# Patient Record
Sex: Female | Born: 1960 | Race: White | Hispanic: No | Marital: Married | Smoking: Never smoker
Health system: Southern US, Community
[De-identification: ages and names within clinical notes are randomized; demographics above are authoritative.]

## PROBLEM LIST (undated history)

## (undated) DIAGNOSIS — K219 Gastro-esophageal reflux disease without esophagitis: Secondary | ICD-10-CM

## (undated) DIAGNOSIS — I471 Supraventricular tachycardia: Secondary | ICD-10-CM

## (undated) DIAGNOSIS — T7840XA Allergy, unspecified, initial encounter: Secondary | ICD-10-CM

## (undated) DIAGNOSIS — R7301 Impaired fasting glucose: Secondary | ICD-10-CM

## (undated) DIAGNOSIS — M797 Fibromyalgia: Secondary | ICD-10-CM

## (undated) DIAGNOSIS — F419 Anxiety disorder, unspecified: Secondary | ICD-10-CM

## (undated) DIAGNOSIS — M1990 Unspecified osteoarthritis, unspecified site: Secondary | ICD-10-CM

## (undated) HISTORY — DX: Fibromyalgia: M79.7

## (undated) HISTORY — DX: Gastro-esophageal reflux disease without esophagitis: K21.9

## (undated) HISTORY — DX: Allergy, unspecified, initial encounter: T78.40XA

## (undated) HISTORY — DX: Anxiety disorder, unspecified: F41.9

## (undated) HISTORY — PX: BLADDER SURGERY: SHX569

## (undated) HISTORY — DX: Impaired fasting glucose: R73.01

## (undated) HISTORY — DX: Unspecified osteoarthritis, unspecified site: M19.90

## (undated) HISTORY — DX: Supraventricular tachycardia: I47.1

---

## 1990-10-08 HISTORY — PX: TUBAL LIGATION: SHX77

## 1998-09-29 ENCOUNTER — Other Ambulatory Visit: Admission: RE | Admit: 1998-09-29 | Discharge: 1998-09-29 | Payer: Self-pay | Admitting: Gynecology

## 2000-02-12 ENCOUNTER — Other Ambulatory Visit: Admission: RE | Admit: 2000-02-12 | Discharge: 2000-02-12 | Payer: Self-pay | Admitting: Gynecology

## 2001-02-18 ENCOUNTER — Other Ambulatory Visit: Admission: RE | Admit: 2001-02-18 | Discharge: 2001-02-18 | Payer: Self-pay | Admitting: Gynecology

## 2003-01-11 ENCOUNTER — Other Ambulatory Visit: Admission: RE | Admit: 2003-01-11 | Discharge: 2003-01-11 | Payer: Self-pay | Admitting: Gynecology

## 2004-05-22 ENCOUNTER — Other Ambulatory Visit: Admission: RE | Admit: 2004-05-22 | Discharge: 2004-05-22 | Payer: Self-pay | Admitting: Gynecology

## 2005-06-04 ENCOUNTER — Other Ambulatory Visit: Admission: RE | Admit: 2005-06-04 | Discharge: 2005-06-04 | Payer: Self-pay | Admitting: Gynecology

## 2006-12-07 HISTORY — PX: CHOLECYSTECTOMY: SHX55

## 2007-02-06 HISTORY — PX: PARTIAL HYSTERECTOMY: SHX80

## 2007-02-11 ENCOUNTER — Inpatient Hospital Stay (HOSPITAL_COMMUNITY): Admission: RE | Admit: 2007-02-11 | Discharge: 2007-02-12 | Payer: Self-pay | Admitting: Obstetrics and Gynecology

## 2011-01-01 ENCOUNTER — Other Ambulatory Visit: Payer: Self-pay | Admitting: Gynecology

## 2011-04-02 ENCOUNTER — Ambulatory Visit (INDEPENDENT_AMBULATORY_CARE_PROVIDER_SITE_OTHER): Payer: 59 | Admitting: Gastroenterology

## 2011-04-02 ENCOUNTER — Encounter: Payer: Self-pay | Admitting: Gastroenterology

## 2011-04-02 ENCOUNTER — Other Ambulatory Visit (INDEPENDENT_AMBULATORY_CARE_PROVIDER_SITE_OTHER): Payer: 59

## 2011-04-02 VITALS — BP 124/80 | HR 84 | Ht 60.0 in | Wt 161.0 lb

## 2011-04-02 DIAGNOSIS — R1013 Epigastric pain: Secondary | ICD-10-CM

## 2011-04-02 DIAGNOSIS — R197 Diarrhea, unspecified: Secondary | ICD-10-CM

## 2011-04-02 LAB — HEPATIC FUNCTION PANEL
ALT: 22 U/L (ref 0–35)
AST: 21 U/L (ref 0–37)
Alkaline Phosphatase: 84 U/L (ref 39–117)
Bilirubin, Direct: 0.1 mg/dL (ref 0.0–0.3)
Total Bilirubin: 0.7 mg/dL (ref 0.3–1.2)

## 2011-04-02 MED ORDER — HYOSCYAMINE SULFATE 0.125 MG SL SUBL: SUBLINGUAL_TABLET | SUBLINGUAL | Status: DC

## 2011-04-02 MED ORDER — OMEPRAZOLE 40 MG PO CPDR
40.0000 mg | DELAYED_RELEASE_CAPSULE | Freq: Every day | ORAL | Status: DC
Start: 2011-04-02 — End: 2018-07-16

## 2011-04-02 MED ORDER — PEG-KCL-NACL-NASULF-NA ASC-C 100 G PO SOLR: 1.0000 | Freq: Once | ORAL | Status: DC

## 2011-04-02 NOTE — Progress Notes (Signed)
History of Present Illness: This is a 50 year old female who relates a long history of urgent, postprandial, loose stools. These symptoms have not changed for many years. She denies rectal bleeding and weight loss. She also has episodic epigastric pain associated with occasional reflux symptoms. She generally has minimal epigastric discomfort occasionally exacerbated by meals. She has had 2 severe attacks of epigastric pain which led to emergency room visits most recently on June 20. Her  records from that visit at [facility] were reviewed. Her blood work and urinalysis were unremarkable. Chest x-ray, CT scan of the head, chest and abdomen were unremarkable except for stool noted in the cecum. During her most recent ER visit she also had weakness, dizziness and near syncope. She is status post cholecystectomy 2008. She denies dysphagia, nausea, vomiting, weight loss, hematochezia, melena, hematemesis.  Past Medical History  Diagnosis Date  . Fibromyalgia    Past Surgical History  Procedure Date  . Cholecystectomy   . Partial hysterectomy   . Tubal ligation 1992  . Bladder surgery     bladder tact    reports that she has never smoked. She has never used smokeless tobacco. She reports that she does not drink alcohol or use illicit drugs. family history includes Heart disease in her maternal grandmother; Hiatal hernia in her maternal grandmother; Hypertension in her mother; Lung cancer in her paternal grandmother; Parkinsonism in her maternal grandfather; and Prostate cancer in her father. No Known Allergies   Outpatient Encounter Prescriptions as of 04/02/2011  Medication Sig Dispense Refill  . doxycycline (VIBRAMYCIN) 100 MG capsule Take 100 mg by mouth 2 (two) times daily.        . cetirizine (ZYRTEC) 10 MG tablet Take 10 mg by mouth daily.        . Coenzyme Q10 (CO Q-10) 300 MG CAPS Take 1 tablet by mouth daily.        . Multiple Vitamins-Minerals (MEGA MULTIVITAMIN FOR WOMEN PO) Take 1  tablet by mouth daily.        . Omega-3 Fatty Acids (FISH OIL TRIPLE STRENGTH PO) Take 1 tablet by mouth daily.        . Vitamin D, Ergocalciferol, (DRISDOL) 50000 UNITS CAPS Take 50,000 Units by mouth every 7 (seven) days.         Review of Systems: Pertinent positive and negative review of systems were noted in the above HPI section. All other review of systems were otherwise negative.  Physical Exam: General: Well developed , well nourished, no acute distress Head: Normocephalic and atraumatic Eyes:  sclerae anicteric, EOMI Ears: Normal auditory acuity Mouth: No deformity or lesions Neck: Supple, no masses or thyromegaly Lungs: Clear throughout to auscultation Heart: Regular rate and rhythm; no murmurs, rubs or bruits Abdomen: Soft, non tender and non distended. No masses, hepatosplenomegaly or hernias noted. Normal Bowel sounds Rectal:Deferred to colonoscopy   Musculoskeletal: Symmetrical with no gross deformities  Skin: No lesions on visible extremities Pulses:  Normal pulses noted Extremities: No clubbing, cyanosis, edema or deformities noted Neurological: Alert oriented x 4, grossly nonfocal Cervical Nodes:  No significant cervical adenopathy Inguinal Nodes: No significant inguinal adenopathy Psychological:  Alert and cooperative. Anxious. Normal mood and affect.  Assessment and Recommendations:  1. Recurrent epigastric pain. Rule out GERD, ulcer disease, choledocholithiasis, and functional symptoms. Recent liver function tests were normal. Repeat liver function tests today. If her liver function tests are elevated, or no other cause is found for her symptoms, consider MRCP. Begin omeprazole 40 mg  daily and antireflux measures. Schedule endoscopy. The risks, benefits, and alternatives to endoscopy with possible biopsy and possible dilation were discussed with the patient and they consent to proceed.   2. Chronic postprandial diarrhea with epigastric discomfort. Suspected  irritable bowel syndrome. Trial of hyoscyamine 1 to 2 before meals. Rule out inflammatory bowel disease. Schedule colonoscopy. The risks, benefits, and alternatives to colonoscopy with possible biopsy and possible polypectomy were discussed with the patient and they consent to proceed.

## 2011-04-02 NOTE — Patient Instructions (Signed)
Go directly to the basement to have your labs drawn. You have been scheduled for a Upper Endoscopy/ Colonoscopy. Separate instructions given. Your prescriptions have been sent to your pharmacy.  Patient advised to avoid spicy, acidic, citrus, chocolate, mints, fruit and fruit juices.  Limit the intake of caffeine, alcohol and Soda.  Don't exercise too soon after eating.  Don't lie down within 3-4 hours of eating.  Elevate the head of your bed.

## 2011-04-17 ENCOUNTER — Encounter: Payer: 59 | Admitting: Gastroenterology

## 2011-05-11 ENCOUNTER — Encounter: Payer: 59 | Admitting: Gastroenterology

## 2013-11-19 ENCOUNTER — Other Ambulatory Visit: Payer: Self-pay | Admitting: Gynecology

## 2015-05-18 ENCOUNTER — Other Ambulatory Visit: Payer: Self-pay | Admitting: *Deleted

## 2015-05-18 DIAGNOSIS — R200 Anesthesia of skin: Secondary | ICD-10-CM

## 2015-06-06 ENCOUNTER — Telehealth: Payer: Self-pay | Admitting: Neurology

## 2015-06-06 NOTE — Telephone Encounter (Signed)
Informed patient that she can continue as normal and that we have not asked anyone to hold meds before.

## 2015-06-06 NOTE — Telephone Encounter (Signed)
Pt wants to know will taking the 10mg  of" Flexeril"/Gabapentin meds/ before going to bed tonight? Her EMG is tomorrow/ [phone]

## 2015-06-06 NOTE — Telephone Encounter (Signed)
Called patient back and left message for her to call me back.  

## 2015-06-07 ENCOUNTER — Ambulatory Visit (INDEPENDENT_AMBULATORY_CARE_PROVIDER_SITE_OTHER): Payer: BLUE CROSS/BLUE SHIELD | Admitting: Neurology

## 2015-06-07 DIAGNOSIS — G5602 Carpal tunnel syndrome, left upper limb: Secondary | ICD-10-CM

## 2015-06-07 DIAGNOSIS — R200 Anesthesia of skin: Secondary | ICD-10-CM | POA: Diagnosis not present

## 2015-06-07 NOTE — Procedures (Signed)
[facility]  [place]  [place], [place] [address] Tel: [phone] Fax:  [phone] Test Date:  06/07/2015  Patient: [place] Rojas DOB: 02/04/1961 Physician: Narda Amber  Sex: Female Height: 4\' 10"  Ref Phys: D. Theda Sers, M.D.  ID#: [id number] Temp: 32.9C Technician: Jerilynn Mages. Dean   Patient Complaints: This is a 54 year old female presenting for evaluation of left hand numbness and left low back pain radiating into the left hip.   NCV & EMG Findings: Extensive electrodiagnostic testing of the left upper extremity and lower extremity shows:  1. Left median sensory response is prolonged distal peak latency (4.3 ms) and reduced amplitude (10.8 V).  Left ulnar and radial sensory responses are within normal limits.  2. Left median and ulnar motor responses are within normal limits. 3. Left sural and superficial peroneal sensory responses are within normal limits. 4. Left peroneal and tibial motor responses are within normal limits. 5. There is no evidence of active or chronic motor axon loss changes affecting any of the tested muscles. Motor unit configuration and recruitment pattern is within normal limits.   Impression: 1. Left median neuropathy at or distal to the wrist, consistent with the clinical diagnosis of carpal tunnel syndrome. Overall, these findings are moderate in degree electrically.  2. There is no evidence of a generalized sensorimotor polyneuropathy, diffuse myopathy, or cervical/lumbosacral radiculopathy affecting the left upper or lower extremities.   ___________________________ Narda Amber, DO    Nerve Conduction Studies Anti Sensory Summary Table   Stim Site NR Peak (ms) Norm Peak (ms) P-T Amp (V) Norm P-T Amp  Left Median Anti Sensory (2nd Digit)  32.9C  Wrist    4.3 <3.6 10.8 >15  Left Radial Anti Sensory (Base 1st Digit)  32.9C  Wrist    2.0 <2.7 33.0 >14  Left Sup Peroneal Anti Sensory (Ant Lat Mall)  32.9C  12 cm    3.0 <4.6  16.4 >4  Left Sural Anti Sensory (Lat Mall)  32.9C  Calf    3.2 <4.6 9.4 >4  Left Ulnar Anti Sensory (5th Digit)  32.9C  Wrist    2.6 <3.1 24.3 >10   Motor Summary Table   Stim Site NR Onset (ms) Norm Onset (ms) O-P Amp (mV) Norm O-P Amp Site1 Site2 Delta-0 (ms) Dist (cm) Vel (m/s) Norm Vel (m/s)  Left Median Motor (Abd Poll Brev)  32.9C  Wrist    3.7 <4.0 7.4 >6 Elbow Wrist 4.2 24.0 57 >50  Elbow    7.9  6.8         Left Peroneal Motor (Ext Dig Brev)  32.9C  Ankle    3.9 <6.0 3.2 >2.5 B Fib Ankle 5.7 30.0 53 >40  B Fib    9.6  3.1  Poplt B Fib 1.6 10.0 63 >40  Poplt    11.2  3.1         Left Tibial Motor (Abd Hall Brev)  32.9C  Ankle    3.7 <6.0 16.9 >4 Knee Ankle 7.6 36.0 47 >40  Knee    11.3  12.0         Left Ulnar Motor (Abd Dig Minimi)  32.9C  Wrist    2.1 <3.1 10.4 >7 B Elbow Wrist 2.4 14.0 58 >50  B Elbow    4.5  9.7  A Elbow B Elbow 1.6 10.0 63 >50  A Elbow    6.1  9.5          H Reflex Studies  NR H-Lat (ms) Lat Norm (ms) L-R H-Lat (ms)  Left Tibial (Gastroc)  32.9C     29.39 <35    EMG   Side Muscle Ins Act Fibs Psw Fasc Number Recrt Dur Dur. Amp Amp. Poly Poly. Comment  Left 1stDorInt Nml Nml Nml Nml Nml Nml Nml Nml Nml Nml Nml Nml N/A  Left Abd Poll Brev Nml Nml Nml Nml Nml Nml Nml Nml Nml Nml Nml Nml N/A  Left Ext Indicis Nml Nml Nml Nml Nml Nml Nml Nml Nml Nml Nml Nml N/A  Left PronatorTeres Nml Nml Nml Nml Nml Nml Nml Nml Nml Nml Nml Nml N/A  Left Biceps Nml Nml Nml Nml Nml Nml Nml Nml Nml Nml Nml Nml N/A  Left Triceps Nml Nml Nml Nml Nml Nml Nml Nml Nml Nml Nml Nml N/A  Left Deltoid Nml Nml Nml Nml Nml Nml Nml Nml Nml Nml Nml Nml N/A  Left AntTibialis Nml Nml Nml Nml Nml Nml Nml Nml Nml Nml Nml Nml N/A  Left Gastroc Nml Nml Nml Nml Nml Nml Nml Nml Nml Nml Nml Nml N/A  Left Flex Dig Long Nml Nml Nml Nml Nml Nml Nml Nml Nml Nml Nml Nml N/A  Left RectFemoris Nml Nml Nml Nml Nml Nml Nml Nml Nml Nml Nml Nml N/A  Left GluteusMed Nml Nml Nml Nml Nml Nml  Nml Nml Nml Nml Nml Nml N/A  Left BicepsFemS Nml Nml Nml Nml Nml Nml Nml Nml Nml Nml Nml Nml N/A      Waveforms:

## 2016-10-08 DIAGNOSIS — I471 Supraventricular tachycardia, unspecified: Secondary | ICD-10-CM

## 2016-10-08 HISTORY — PX: CARDIOVERSION: SHX1299

## 2016-10-08 HISTORY — DX: Supraventricular tachycardia: I47.1

## 2016-10-08 HISTORY — DX: Supraventricular tachycardia, unspecified: I47.10

## 2018-07-01 ENCOUNTER — Encounter: Payer: Self-pay | Admitting: Gastroenterology

## 2018-07-01 ENCOUNTER — Telehealth: Payer: Self-pay | Admitting: Gastroenterology

## 2018-07-01 NOTE — Telephone Encounter (Signed)
See note do you both approve of switch?

## 2018-07-01 NOTE — Telephone Encounter (Signed)
No problems 

## 2018-07-01 NOTE — Telephone Encounter (Signed)
OK 

## 2018-07-15 ENCOUNTER — Encounter: Payer: Self-pay | Admitting: Gastroenterology

## 2018-07-16 ENCOUNTER — Ambulatory Visit: Payer: BLUE CROSS/BLUE SHIELD | Admitting: Gastroenterology

## 2018-07-16 ENCOUNTER — Encounter (INDEPENDENT_AMBULATORY_CARE_PROVIDER_SITE_OTHER): Payer: Self-pay

## 2018-07-16 ENCOUNTER — Telehealth: Payer: Self-pay | Admitting: Gastroenterology

## 2018-07-16 ENCOUNTER — Encounter: Payer: Self-pay | Admitting: Gastroenterology

## 2018-07-16 VITALS — BP 132/78 | HR 82 | Ht 60.0 in | Wt 147.0 lb

## 2018-07-16 DIAGNOSIS — K224 Dyskinesia of esophagus: Secondary | ICD-10-CM

## 2018-07-16 DIAGNOSIS — Z1211 Encounter for screening for malignant neoplasm of colon: Secondary | ICD-10-CM | POA: Diagnosis not present

## 2018-07-16 DIAGNOSIS — K219 Gastro-esophageal reflux disease without esophagitis: Secondary | ICD-10-CM | POA: Diagnosis not present

## 2018-07-16 MED ORDER — OMEPRAZOLE 40 MG PO CPDR: 40.0000 mg | DELAYED_RELEASE_CAPSULE | Freq: Every day | ORAL | 11 refills | Status: DC

## 2018-07-16 MED ORDER — AMBULATORY NON FORMULARY MEDICATION: 0 refills | Status: DC

## 2018-07-16 MED ORDER — RANITIDINE HCL 300 MG PO TABS: 300.0000 mg | ORAL_TABLET | Freq: Every day | ORAL | 11 refills | Status: DC

## 2018-07-16 NOTE — Telephone Encounter (Signed)
Can they do dicyclomine instead?

## 2018-07-16 NOTE — Patient Instructions (Addendum)
If you are age 55 or older, your body mass index should be between 23-30. Your Body mass index is 28.71 kg/m. If this is out of the aforementioned range listed, please consider follow up with your Primary Care Provider.  If you are age 68 or younger, your body mass index should be between 19-25. Your Body mass index is 28.71 kg/m. If this is out of the aformentioned range listed, please consider follow up with your Primary Care Provider.   We have sent the following medications to your pharmacy for you to pick up at your convenience: GI Cocktail Ranitidine Omeprazole   It has been recommended to you by your physician that you have a(n) Colonoscopy/EGD completed. We did not schedule the procedure(s) today . Please contact our office at [phone]  to have the procedure completed.  You have been scheduled for a Barium Esophogram at [facility] (1st floor of the hospital) on 08/01/18 at 11am. Please arrive 15 minutes prior to your appointment for registration. Make certain not to have anything to eat or drink 3 hours prior to your test. If you need to reschedule for any reason, please contact radiology at [phone] to do so. __________________________________________________________________ A barium swallow is an examination that concentrates on views of the esophagus. This tends to be a double contrast exam (barium and two liquids which, when combined, create a gas to distend the wall of the oesophagus) or single contrast (non-ionic iodine based). The study is usually tailored to your symptoms so a good history is essential. Attention is paid during the study to the form, structure and configuration of the esophagus, looking for functional disorders (such as aspiration, dysphagia, achalasia, motility and reflux) EXAMINATION You may be asked to change into a gown, depending on the type of swallow being performed. A radiologist and radiographer will perform the procedure. The radiologist  will advise you of the type of contrast selected for your procedure and direct you during the exam. You will be asked to stand, sit or lie in several different positions and to hold a small amount of fluid in your mouth before being asked to swallow while the imaging is performed .In some instances you may be asked to swallow barium coated marshmallows to assess the motility of a solid food bolus. The exam can be recorded as a digital or video fluoroscopy procedure. POST PROCEDURE It will take 1-2 days for the barium to pass through your system. To facilitate this, it is important, unless otherwise directed, to increase your fluids for the next 24-48hrs and to resume your normal diet.  This test typically takes about 30 minutes to perform. __________________________________________________________________________________   Thank you, Dr. Jackquline Denmark

## 2018-07-16 NOTE — Telephone Encounter (Signed)
Would you like to switch the Donnatol to something else?

## 2018-07-16 NOTE — Progress Notes (Signed)
Chief Complaint: Esophageal spasms  Referring Provider:  Townsend Roger, MD      ASSESSMENT AND PLAN;   #1. GERD #2. Esophageal spasms with esophageal dysphagia. #3. Colorectal cancer screening.  Plan: - Omeprazole 40mg  po qAM and Zantac 300 mg p.o. nightly to continue. - Ba swallow with barium tablet. - EGD with dil and colon.  Explained risks and benefits.  Consent forms were given. - GI coctail prn - If still with problems, trial of nitroglycerin/calcium channel blockers/Levsin followed by esophageal manometry if needed. -Stop all nonsteroidals.   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 Has nausea - better now.  Started on omeprazole and Zantac with good relief. Alt diarrhea and constipation with some abdominal bloating.  She has been diagnosed as having IBS   Past Medical History:  Diagnosis Date  . Anxiety   . Fibromyalgia   . Impaired fasting glucose     Past Surgical History:  Procedure Laterality Date  . BLADDER SURGERY     bladder tact  . CHOLECYSTECTOMY  12/2006  . PARTIAL HYSTERECTOMY  02/2007  . TUBAL LIGATION  1992    Family History  Problem Relation Age of Onset  . Hypertension Mother   . Prostate cancer Father   . Lung cancer Paternal Grandmother   . Parkinsonism Maternal Grandfather   . Heart disease Maternal Grandmother   . Hiatal hernia Maternal Grandmother   . Colon cancer Neg Hx   . Esophageal cancer Neg Hx     Social History   Tobacco Use  . Smoking status: Never Smoker  . Smokeless tobacco: Never Used  Substance Use Topics  . Alcohol use: No  . Drug use: No    Current Outpatient Medications  Medication Sig Dispense Refill  . omeprazole (PRILOSEC) 40 MG capsule Take 40 mg by mouth daily.     . ranitidine (ZANTAC) 300 MG tablet Take 300 mg by mouth at bedtime.     No current facility-administered medications for this visit.     No Known Allergies  Review of Systems:  Constitutional: Denies fever, chills, diaphoresis, appetite change and fatigue.  HEENT: Denies photophobia, eye pain, redness, hearing loss, ear pain, congestion, sore throat, rhinorrhea, sneezing, mouth sores, neck pain, neck stiffness and tinnitus.   Respiratory: Denies SOB, DOE, cough, chest tightness,  and wheezing.   Cardiovascular: Denies chest pain, palpitations and leg swelling.  Genitourinary: Denies dysuria, urgency, frequency, hematuria, flank pain and difficulty urinating.  Musculoskeletal: Denies myalgias, back pain, joint swelling, arthralgias and gait problem.  Skin: No rash.  Neurological: Denies dizziness, seizures, syncope, weakness, light-headedness, numbness and headaches.  Hematological: Denies adenopathy. Easy bruising, personal or family bleeding history  Psychiatric/Behavioral: No anxiety or depression     Physical Exam:    BP 132/78   Pulse 82   Ht 5' (1.524 m)   Wt 147 lb (66.7 kg)   BMI 28.71 kg/m  Filed Weights   07/16/18 0855  Weight: 147 lb (66.7 kg)   Constitutional:  Well-developed, in no acute distress. Psychiatric: Normal mood and affect. Behavior is normal. HEENT: Pupils normal.  Conjunctivae are normal. No scleral icterus. Neck supple.  Cardiovascular: Normal rate, regular rhythm. No edema Pulmonary/chest: Effort normal and breath sounds normal. No wheezing,  rales or rhonchi. Abdominal: Soft, nondistended. Nontender. Bowel sounds active throughout. There are no masses palpable. No hepatomegaly. Rectal:  defered Neurological: Alert and oriented to person place and time. Skin: Skin is warm and dry. No rashes noted.  Data Reviewed: I have personally reviewed following labs and imaging studies  CBC: No flowsheet data found.  CMP: CMP Latest Ref Rng & Units  04/02/2011  Total Protein 6.0 - 8.3 g/dL 7.3  Total Bilirubin 0.3 - 1.2 mg/dL 0.7  Alkaline Phos 39 - 117 U/L 84  AST 0 - 37 U/L 21  ALT 0 - 35 U/L 22     Carmell Austria, MD 07/16/2018, 9:17 AM  Cc: Townsend Roger, MD

## 2018-07-17 NOTE — Telephone Encounter (Signed)
Called [facility] and spoke to the pharmacist they are going to switch the Donnatol in the GI Cocktail to Dicyclomine.

## 2018-07-21 ENCOUNTER — Telehealth: Payer: Self-pay | Admitting: Gastroenterology

## 2018-07-21 NOTE — Telephone Encounter (Signed)
Patient states she didn't know about the barium swallow test scheduled for 10.25.19 and she will not be in town that week. Patient is requesting a call back to reschedule. And she will cb to scheudled endo colon for Dec 2019 when schedule opens.

## 2018-07-21 NOTE — Telephone Encounter (Signed)
Pt called and given phone number [phone] to reschedule the barium swallow to a date that works for her.

## 2018-08-01 ENCOUNTER — Ambulatory Visit (HOSPITAL_COMMUNITY): Payer: BLUE CROSS/BLUE SHIELD

## 2018-08-04 ENCOUNTER — Encounter: Payer: Self-pay | Admitting: Gastroenterology

## 2018-08-12 ENCOUNTER — Ambulatory Visit (HOSPITAL_COMMUNITY)
Admission: RE | Admit: 2018-08-12 | Discharge: 2018-08-12 | Disposition: A | Discharge status: Home / Self Care | Payer: BLUE CROSS/BLUE SHIELD | Source: Ambulatory Visit | Attending: Gastroenterology | Admitting: Gastroenterology

## 2018-08-12 DIAGNOSIS — K219 Gastro-esophageal reflux disease without esophagitis: Secondary | ICD-10-CM

## 2018-08-12 DIAGNOSIS — K224 Dyskinesia of esophagus: Secondary | ICD-10-CM | POA: Insufficient documentation

## 2018-08-28 ENCOUNTER — Ambulatory Visit (AMBULATORY_SURGERY_CENTER): Payer: Self-pay

## 2018-08-28 ENCOUNTER — Encounter: Payer: Self-pay | Admitting: Gastroenterology

## 2018-08-28 VITALS — Ht 60.0 in | Wt 149.0 lb

## 2018-08-28 DIAGNOSIS — Z1211 Encounter for screening for malignant neoplasm of colon: Secondary | ICD-10-CM

## 2018-08-28 DIAGNOSIS — K219 Gastro-esophageal reflux disease without esophagitis: Secondary | ICD-10-CM

## 2018-08-28 MED ORDER — NA SULFATE-K SULFATE-MG SULF 17.5-3.13-1.6 GM/177ML PO SOLN: 1.0000 | Freq: Once | ORAL | 0 refills | Status: AC

## 2018-08-28 NOTE — Progress Notes (Signed)
No egg or soy allergy known to patient  issues with past sedation with any surgeries  or procedures, no intubation problems. N and voimiting in past with ansthesia No diet pills per patient No home 02 use per patient  No blood thinners per patient  Pt denies issues with constipation  No A fib or A flutter  EMMI video sent to pt's e mail.Declined $15 coupon

## 2018-09-11 ENCOUNTER — Encounter: Payer: Self-pay | Admitting: Gastroenterology

## 2018-09-11 ENCOUNTER — Ambulatory Visit (AMBULATORY_SURGERY_CENTER): Payer: BLUE CROSS/BLUE SHIELD | Admitting: Gastroenterology

## 2018-09-11 VITALS — BP 111/68 | HR 64 | Temp 98.6°F | Resp 14 | Ht 60.0 in | Wt 149.0 lb

## 2018-09-11 DIAGNOSIS — K2970 Gastritis, unspecified, without bleeding: Secondary | ICD-10-CM

## 2018-09-11 DIAGNOSIS — K635 Polyp of colon: Secondary | ICD-10-CM | POA: Diagnosis not present

## 2018-09-11 DIAGNOSIS — D124 Benign neoplasm of descending colon: Secondary | ICD-10-CM | POA: Diagnosis not present

## 2018-09-11 DIAGNOSIS — R1310 Dysphagia, unspecified: Secondary | ICD-10-CM | POA: Diagnosis not present

## 2018-09-11 DIAGNOSIS — Z1211 Encounter for screening for malignant neoplasm of colon: Secondary | ICD-10-CM

## 2018-09-11 DIAGNOSIS — K219 Gastro-esophageal reflux disease without esophagitis: Secondary | ICD-10-CM | POA: Diagnosis not present

## 2018-09-11 DIAGNOSIS — D122 Benign neoplasm of ascending colon: Secondary | ICD-10-CM

## 2018-09-11 MED ORDER — SODIUM CHLORIDE 0.9 % IV SOLN: 500.0000 mL | Freq: Once | INTRAVENOUS | Status: DC

## 2018-09-11 MED ORDER — FAMOTIDINE 20 MG PO TABS: 20.0000 mg | ORAL_TABLET | Freq: Every day | ORAL | 6 refills | Status: DC

## 2018-09-11 NOTE — Op Note (Signed)
[place] Patient Name: [place] Rojas Procedure Date: 09/11/2018 1:02 PM MRN: [id number] Endoscopist: Jackquline Denmark , MD Age: 57 Referring MD:  Date of Birth: [birth year] Gender: Female Account #: [id number] Procedure:                Upper GI endoscopy Indications:              Dysphagia, barium swallow showing minimal                            esophageal dysmotility. Medicines:                Monitored Anesthesia Care Procedure:                Pre-Anesthesia Assessment:                           - Prior to the procedure, a History and Physical                            was performed, and patient medications and                            allergies were reviewed. The patient's tolerance of                            previous anesthesia was also reviewed. The risks                            and benefits of the procedure and the sedation                            options and risks were discussed with the patient.                            All questions were answered, and informed consent                            was obtained. Prior Anticoagulants: The patient has                            taken no previous anticoagulant or antiplatelet                            agents. ASA Grade Assessment: I - A normal, healthy                            patient. After reviewing the risks and benefits,                            the patient was deemed in satisfactory condition to                            undergo the procedure.  After obtaining informed consent, the endoscope was                            passed under direct vision. Throughout the                            procedure, the patient's blood pressure, pulse, and                            oxygen saturations were monitored continuously. The                            Endoscope was introduced through the mouth, and                            advanced to the second part of duodenum. The upper                        GI endoscopy was accomplished without difficulty.                            The patient tolerated the procedure well. Scope In: Scope Out: Findings:                 No endoscopic abnormality was evident in the                            esophagus to explain the patient's complaint of                            dysphagia. It was decided, however, to proceed with                            dilation of the entire esophagus. The scope was                            withdrawn. Dilation was performed with a Maloney                            dilator with no resistance at 48 Fr. Biopsies were                            taken with a cold forceps for histology.                           The Z-line was regular and was found 36 cm from the                            incisors.                           Localized minimal inflammation characterized by                            erythema was  found in the gastric antrum. Biopsies                            were taken with a cold forceps for histology.                           The examined duodenum was normal. Biopsies for                            histology were taken with a cold forceps for                            evaluation of celiac disease. Complications:            No immediate complications. Estimated Blood Loss:     Estimated blood loss: none. Impression:               -Minimal gastritis. Biopsied.                           -Otherwise normal EGD. Status post empiric                            esophageal dilatation. Recommendation:           - Patient has a contact number available for                            emergencies. The signs and symptoms of potential                            delayed complications were discussed with the                            patient. Return to normal activities tomorrow.                            Written discharge instructions were provided to the                            patient.                            - Post dilatation diet.                           - Continue present medications.                           - Await pathology results.                           - Return to GI clinic PRN. Jackquline Denmark, MD 09/11/2018 1:34:07 PM This report has been signed electronically.

## 2018-09-11 NOTE — Progress Notes (Signed)
Called to room to assist during endoscopic procedure.  Patient ID and intended procedure confirmed with present staff. Received instructions for my participation in the procedure from the performing physician.  

## 2018-09-11 NOTE — Patient Instructions (Signed)
Follow Dilation Diet. Continue present medications. Please read handouts provided. Await pathology results. Begin Pepcid 20 mg every evening.     YOU HAD AN ENDOSCOPIC PROCEDURE TODAY AT [facility]:   Refer to the procedure report that was given to you for any specific questions about what was found during the examination.  If the procedure report does not answer your questions, please call your gastroenterologist to clarify.  If you requested that your care partner not be given the details of your procedure findings, then the procedure report has been included in a sealed envelope for you to review at your convenience later.  YOU SHOULD EXPECT: Some feelings of bloating in the abdomen. Passage of more gas than usual.  Walking can help get rid of the air that was put into your GI tract during the procedure and reduce the bloating. If you had a lower endoscopy (such as a colonoscopy or flexible sigmoidoscopy) you may notice spotting of blood in your stool or on the toilet paper. If you underwent a bowel prep for your procedure, you may not have a normal bowel movement for a few days.  Please Note:  You might notice some irritation and congestion in your nose or some drainage.  This is from the oxygen used during your procedure.  There is no need for concern and it should clear up in a day or so.  SYMPTOMS TO REPORT IMMEDIATELY:   Following lower endoscopy (colonoscopy or flexible sigmoidoscopy):  Excessive amounts of blood in the stool  Significant tenderness or worsening of abdominal pains  Swelling of the abdomen that is new, acute  Fever of 100F or higher   Following upper endoscopy (EGD)  Vomiting of blood or coffee ground material  New chest pain or pain under the shoulder blades  Painful or persistently difficult swallowing  New shortness of breath  Fever of 100F or higher  Black, tarry-looking stools  For urgent or emergent issues, a gastroenterologist can  be reached at any hour by calling [phone].   DIET:  Drink plenty of fluids but you should avoid alcoholic beverages for 24 hours.  ACTIVITY:  You should plan to take it easy for the rest of today and you should NOT DRIVE or use heavy machinery until tomorrow (because of the sedation medicines used during the test).    FOLLOW UP: Our staff will call the number listed on your records the next business day following your procedure to check on you and address any questions or concerns that you may have regarding the information given to you following your procedure. If we do not reach you, we will leave a message.  However, if you are feeling well and you are not experiencing any problems, there is no need to return our call.  We will assume that you have returned to your regular daily activities without incident.  If any biopsies were taken you will be contacted by phone or by letter within the next 1-3 weeks.  Please call us at [phone] if you have not heard about the biopsies in 3 weeks.    SIGNATURES/CONFIDENTIALITY: You and/or your care partner have signed paperwork which will be entered into your electronic medical record.  These signatures attest to the fact that that the information above on your After Visit Summary has been reviewed and is understood.  Full responsibility of the confidentiality of this discharge information lies with you and/or your care-partner.

## 2018-09-11 NOTE — Progress Notes (Signed)
Report to PACU, RN, vss, BBS= Clear.  

## 2018-09-11 NOTE — Progress Notes (Signed)
Pt's states no medical or surgical changes since previsit or office visit. 

## 2018-09-11 NOTE — Op Note (Signed)
[place] Patient Name: Taylor Rojas Procedure Date: 09/11/2018 1:14 PM MRN: [id number] Endoscopist: Jackquline Denmark , MD Age: 57 Referring MD:  Date of Birth: December 26, 1960 Gender: Female Account #: [id number] Procedure:                Colonoscopy Indications:              Screening for colorectal malignant neoplasm Medicines:                Monitored Anesthesia Care Procedure:                Pre-Anesthesia Assessment:                           - Prior to the procedure, a History and Physical                            was performed, and patient medications and                            allergies were reviewed. The patient's tolerance of                            previous anesthesia was also reviewed. The risks                            and benefits of the procedure and the sedation                            options and risks were discussed with the patient.                            All questions were answered, and informed consent                            was obtained. Prior Anticoagulants: The patient has                            taken no previous anticoagulant or antiplatelet                            agents. ASA Grade Assessment: I - A normal, healthy                            patient. After reviewing the risks and benefits,                            the patient was deemed in satisfactory condition to                            undergo the procedure.                           After obtaining informed consent, the colonoscope  was passed under direct vision. Throughout the                            procedure, the patient's blood pressure, pulse, and                            oxygen saturations were monitored continuously. The                            Colonoscope was introduced through the anus and                            advanced to the 2 cm into the ileum. The                            colonoscopy was performed without  difficulty. The                            patient tolerated the procedure well. The quality                            of the bowel preparation was excellent. Scope In: 1:17:03 PM Scope Out: 1:29:12 PM Scope Withdrawal Time: 0 hours 8 minutes 30 seconds  Total Procedure Duration: 0 hours 12 minutes 9 seconds  Findings:                 A 8 mm polyp was found in the proximal ascending                            colon. The polyp was sessile. The polyp was removed                            with a hot snare. Resection and retrieval were                            complete.                           A 6 mm polyp was found in the mid descending colon.                            The polyp was sessile. The polyp was removed with a                            cold snare. Resection and retrieval were complete.                           The exam was otherwise without abnormality on                            direct and retroflexion views. Complications:            No immediate complications. Estimated Blood Loss:     Estimated blood loss: none. Impression:               -  Colonic polyps status post polypectomy.                           -Otherwise normal colonoscopy to TI. Recommendation:           - Patient has a contact number available for                            emergencies. The signs and symptoms of potential                            delayed complications were discussed with the                            patient. Return to normal activities tomorrow.                            Written discharge instructions were provided to the                            patient.                           - Resume previous diet.                           - Continue present medications.                           - Await pathology results.                           - Repeat colonoscopy for surveillance based on                            pathology results.                           - Return to GI clinic  PRN. Jackquline Denmark, MD 09/11/2018 1:37:08 PM This report has been signed electronically.

## 2018-09-12 ENCOUNTER — Telehealth: Payer: Self-pay

## 2018-09-12 NOTE — Telephone Encounter (Signed)
Message given to pt from Dr. Lyndel Safe.  I advised her to call us back if not improving.. Pt said she would.

## 2018-09-12 NOTE — Telephone Encounter (Signed)
  Follow up Call-  Call back number 09/11/2018  Post procedure Call Back phone  # [phone]  Permission to leave phone message Yes  Some recent data might be hidden     Patient questions:  Do you have a fever, pain , or abdominal swelling? No. Pain Score  0 *  Have you tolerated food without any problems? Yes.    Have you been able to return to your normal activities? Yes.    Do you have any questions about your discharge instructions: Diet   No. Medications  No. Follow up visit  No.  Do you have questions or concerns about your Care? Yes.    Actions: * If pain score is 4 or above: No action needed, pain <4.  Pt said she has been having esophageal spasms through the night and this am.  She said "it feels like I have a golf ball in my throat".  She took four bites of apple sauce this am and still throat fees the same as last night.  Pt also reported a headache this am.  Please advise

## 2018-09-12 NOTE — Telephone Encounter (Signed)
Can take mallox or myelanta 15 cc po before eating May take 4-5 days to get better

## 2018-09-24 ENCOUNTER — Encounter: Payer: Self-pay | Admitting: Gastroenterology

## 2019-09-18 IMAGING — RF DG ESOPHAGUS
12 of 14 series · 15 of 24 positions shown · non-contrast
Comparison: CT Abdomen and Pelvis 03/27/2011.

CLINICAL DATA: 57-year-old female with worsening heartburn,
indigestion and squeezing type chest pressure over the last 2 months
when eating. Postprandial fullness.

EXAM:
ESOPHOGRAM / BARIUM SWALLOW / BARIUM TABLET STUDY
TECHNIQUE: Combined double contrast and single contrast examination performed
using effervescent crystals, thick barium liquid, and thin barium
liquid. The patient was observed with fluoroscopy swallowing a 13 mm
barium sulphate tablet.
FLUOROSCOPY TIME:  Fluoroscopy Time:  1 minutes 54 seconds
Radiation Exposure Index (if provided by the fluoroscopic device):
17.8 mGy
Number of Acquired Spot Images: 0

[Series 1: cp_standard · 0.37mm/px · 1 of 75 frames shown (1 of 12)]
[frame 3/75]
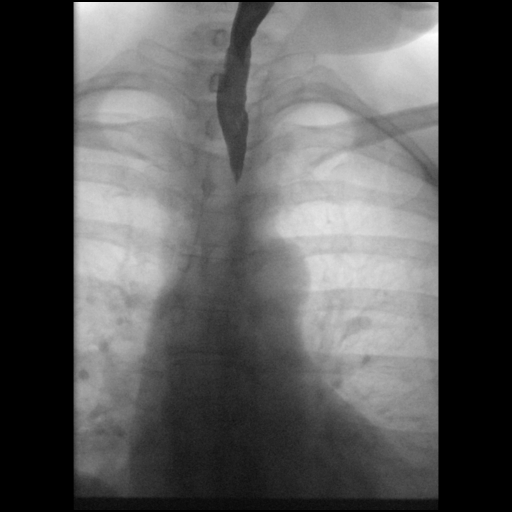

[Series 2: cp_standard · 0.37mm/px · 2 of 23 frames shown (2 of 12)]
[frame 4/23]
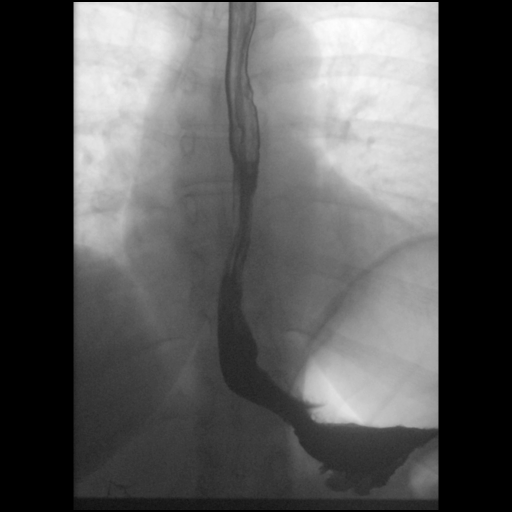
[frame 20/23]
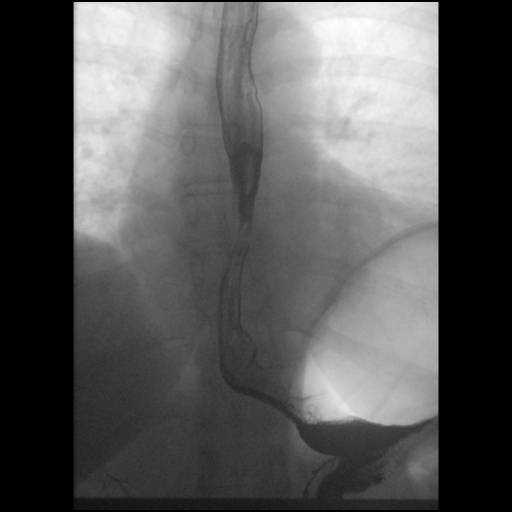

[Series 4: cp_standard · 0.18mm/px · 1 of 1 slices shown (3 of 12)]
[im 1/1]
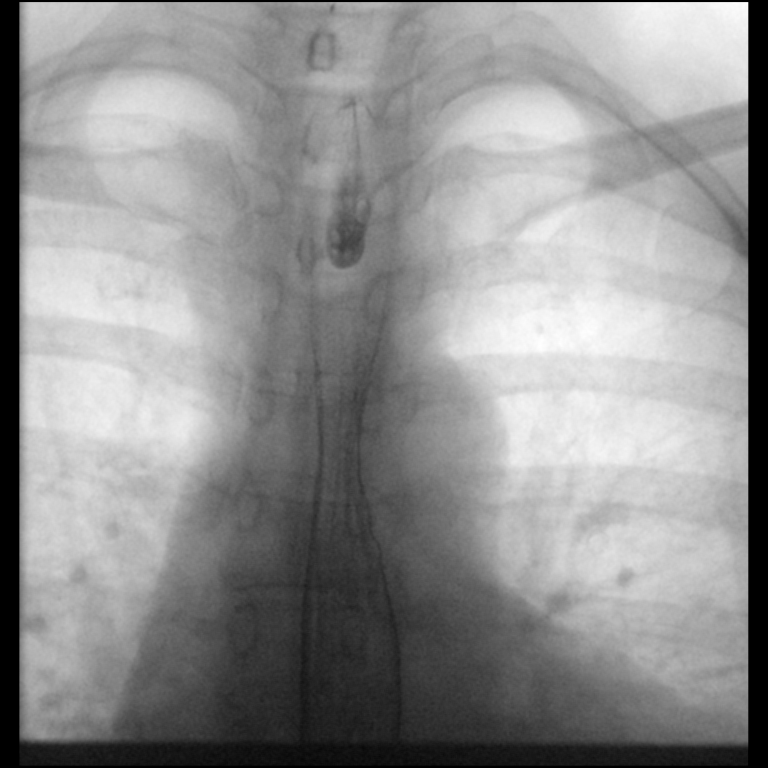

[Series 7: cp_standard · 0.37mm/px · 2 of 35 frames shown (4 of 12)]
[frame 16/35]
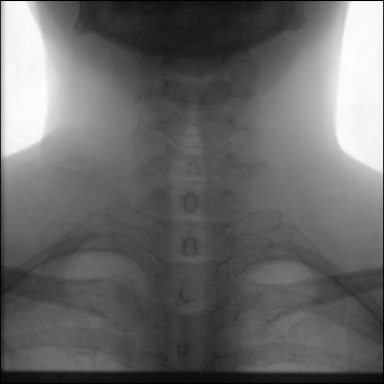
[frame 30/35]
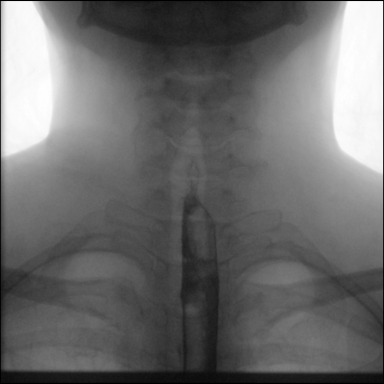

[Series 8: cp_standard · 0.37mm/px · 1 of 53 frames shown (5 of 12)]
[frame 27/53]
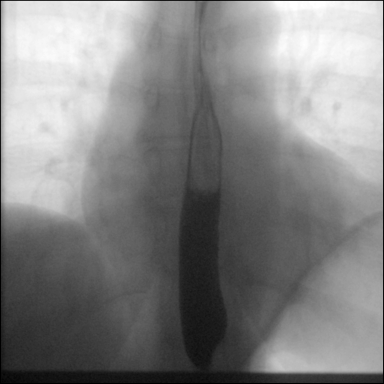

[Series 9: cp_standard · 0.36mm/px · 1 of 39 frames shown (6 of 12)]
[frame 20/39]
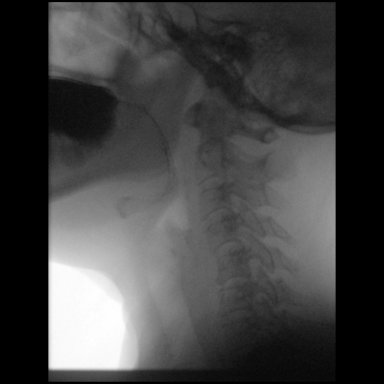

[Series 10: cp_standard · 0.36mm/px · 1 of 55 frames shown (7 of 12)]
[frame 9/55]
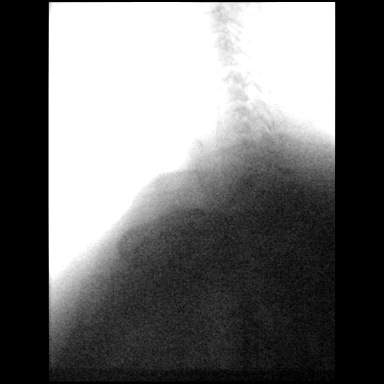

[Series 11: cp_standard · 0.29mm/px · 1 of 1 slices shown (8 of 12)]
[im 1/1]
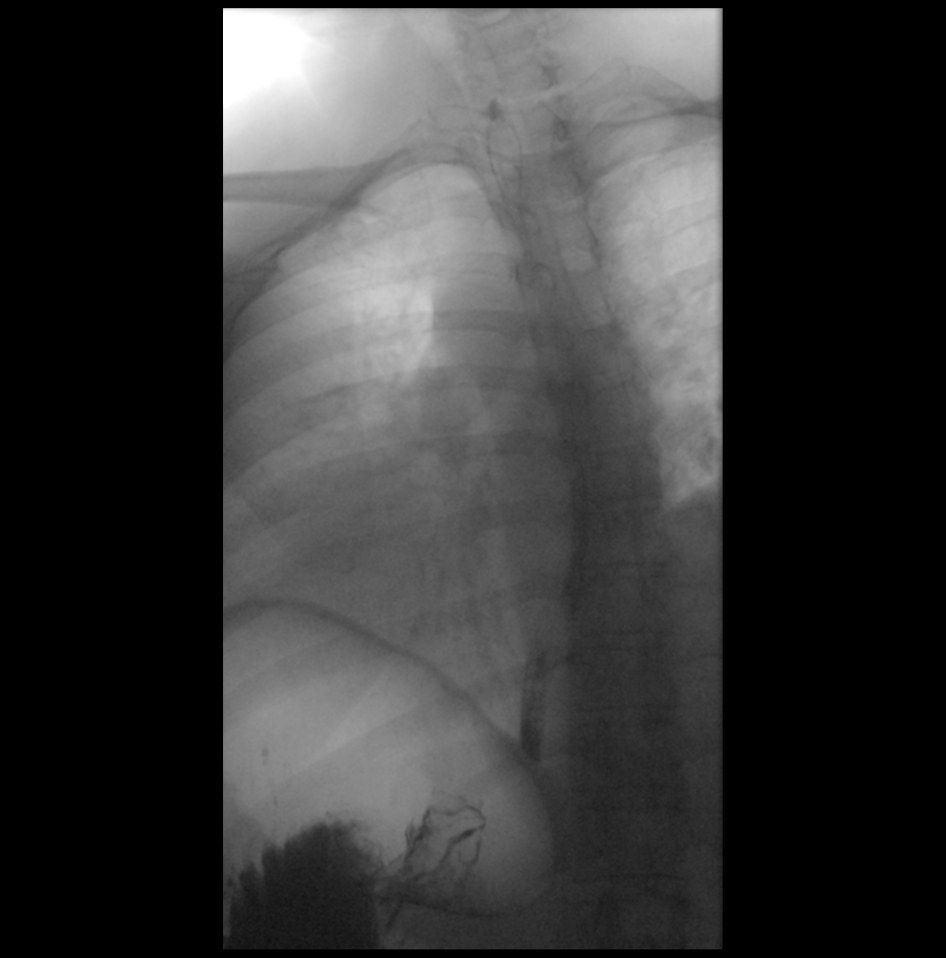

[Series 12: cp_standard · 0.38mm/px · 1 of 143 frames shown (9 of 12)]
[frame 72/143]
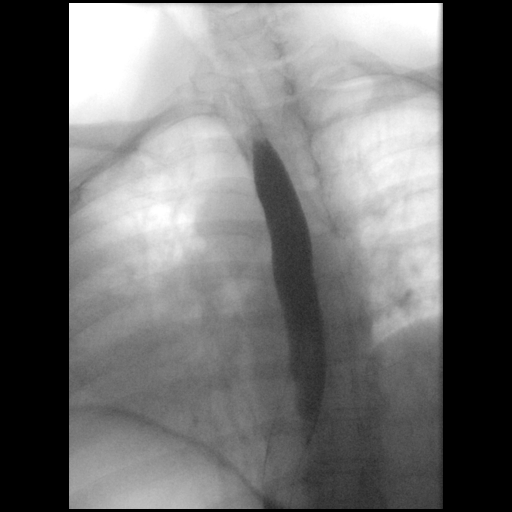

[Series 13: cp_standard · 0.39mm/px · 1 of 70 frames shown (10 of 12)]
[frame 17/70]
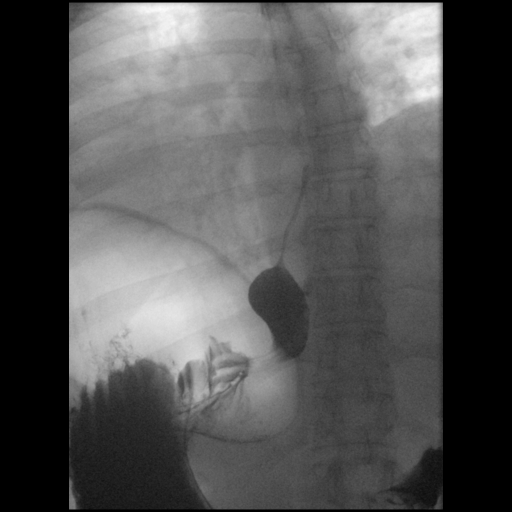

[Series 14: cp_standard · 0.41mm/px · 2 of 37 frames shown (11 of 12)]
[frame 19/37]
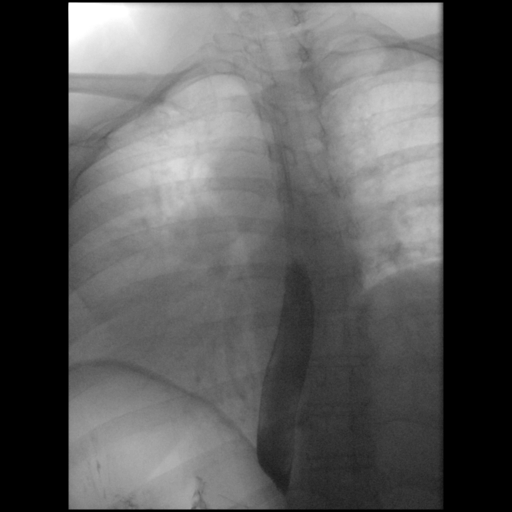
[frame 32/37]
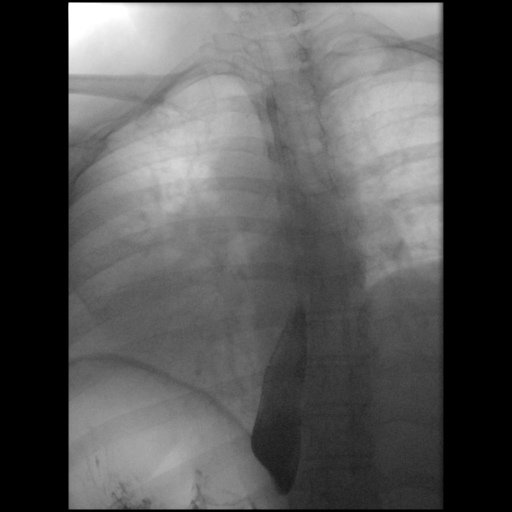

[Series 17: cp_standard · 0.20mm/px · 1 of 1 slices shown (12 of 12)]
[im 1/1]
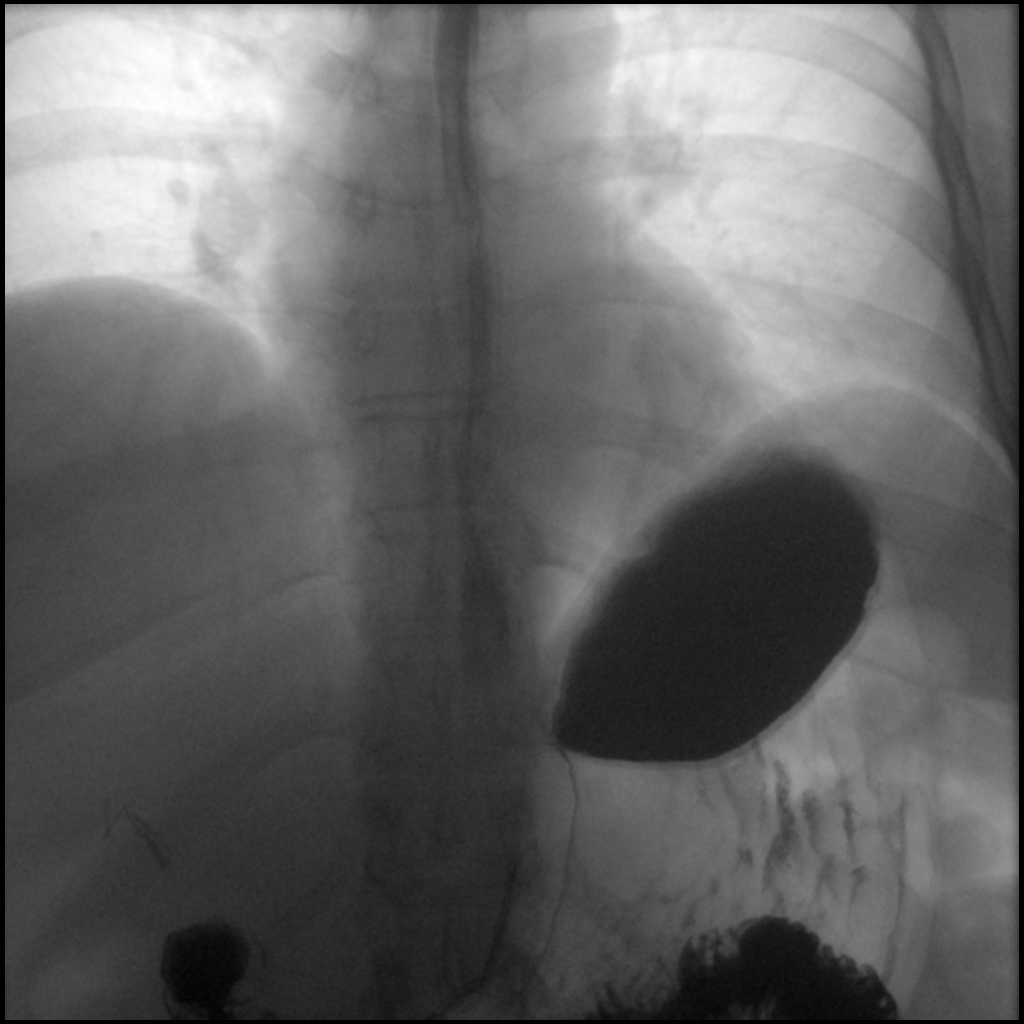

[15 of 24 positions shown; findings below may reference images not displayed]

FINDINGS: A double contrast study was undertaken and the patient tolerated
this well and without difficulty.

No obstruction to the forward flow of contrast throughout the
esophagus and into the stomach. Normal esophageal course and
contour. Normal esophageal mucosal pattern.

A 12.5 mm barium tablet was administered and passed freely to the
stomach without delay.

Dedicated imaging of the cervical esophagus is within normal limits.

Normal gastroesophageal junction.

With prone swallows esophageal motility is normal in the proximal
and mid esophagus. Distally there is mildly decreased motility in
the distal esophagus a release of contrast from that area but no
tertiary contractions occurred.

While supine a trace amount of gastroesophageal reflux occurred.
Incidentally, gastric emptying during this time appeared fairly
normal.
IMPRESSION: Normal esophagram aside from mildly decreased motility in the distal
esophagus, and suspected trace gastroesophageal reflux during the
study.

## 2020-08-29 ENCOUNTER — Other Ambulatory Visit: Payer: Self-pay | Admitting: Gastroenterology

## 2023-05-27 ENCOUNTER — Encounter: Payer: Self-pay | Admitting: Gastroenterology

## 2023-08-26 ENCOUNTER — Ambulatory Visit: Payer: No Typology Code available for payment source

## 2023-08-26 ENCOUNTER — Encounter: Payer: Self-pay | Admitting: Gastroenterology

## 2023-08-26 VITALS — Ht 60.0 in | Wt 148.0 lb

## 2023-08-26 DIAGNOSIS — Z860100 Personal history of colon polyps, unspecified: Secondary | ICD-10-CM

## 2023-08-26 MED ORDER — NA SULFATE-K SULFATE-MG SULF 17.5-3.13-1.6 GM/177ML PO SOLN: 1.0000 | Freq: Once | ORAL | 0 refills | Status: AC

## 2023-08-26 NOTE — Progress Notes (Signed)

## 2023-09-16 ENCOUNTER — Encounter: Payer: Self-pay | Admitting: Gastroenterology

## 2023-09-16 ENCOUNTER — Ambulatory Visit (AMBULATORY_SURGERY_CENTER): Payer: No Typology Code available for payment source | Admitting: Gastroenterology

## 2023-09-16 VITALS — BP 104/62 | HR 66 | Temp 98.4°F | Resp 12 | Ht 60.0 in | Wt 148.0 lb

## 2023-09-16 DIAGNOSIS — Z860100 Personal history of colon polyps, unspecified: Secondary | ICD-10-CM

## 2023-09-16 DIAGNOSIS — Z860101 Personal history of adenomatous and serrated colon polyps: Secondary | ICD-10-CM | POA: Diagnosis not present

## 2023-09-16 DIAGNOSIS — Z1211 Encounter for screening for malignant neoplasm of colon: Secondary | ICD-10-CM | POA: Diagnosis present

## 2023-09-16 DIAGNOSIS — K640 First degree hemorrhoids: Secondary | ICD-10-CM

## 2023-09-16 MED ORDER — SODIUM CHLORIDE 0.9 % IV SOLN: 500.0000 mL | INTRAVENOUS | Status: DC

## 2023-09-16 NOTE — Op Note (Signed)
[facility] Patient Name: Taylor Rojas Procedure Date: 09/16/2023 8:08 AM MRN: [id number] Endoscopist: Lynann Bologna , MD, [id number] Age: 62 Referring MD:  Date of Birth: 07-11-1961 Gender: Female Account #: [id number] Procedure:                Colonoscopy Indications:              High risk colon cancer surveillance: Personal                            history of colonic polyps 2019 Medicines:                Monitored Anesthesia Care Procedure:                Pre-Anesthesia Assessment:                           - Prior to the procedure, a History and Physical                            was performed, and patient medications and                            allergies were reviewed. The patient's tolerance of                            previous anesthesia was also reviewed. The risks                            and benefits of the procedure and the sedation                            options and risks were discussed with the patient.                            All questions were answered, and informed consent                            was obtained. Prior Anticoagulants: The patient has                            taken no anticoagulant or antiplatelet agents. ASA                            Grade Assessment: II - A patient with mild systemic                            disease. After reviewing the risks and benefits,                            the patient was deemed in satisfactory condition to                            undergo the procedure.  After obtaining informed consent, the colonoscope                            was passed under direct vision. Throughout the                            procedure, the patient's blood pressure, pulse, and                            oxygen saturations were monitored continuously. The                            Olympus Scope SN [id number] was introduced through the                            anus and advanced to the 2 cm  into the ileum. The                            colonoscopy was performed without difficulty. The                            patient tolerated the procedure well. The quality                            of the bowel preparation was good. The terminal                            ileum, ileocecal valve, appendiceal orifice, and                            rectum were photographed. Scope In: 8:16:24 AM Scope Out: 8:25:18 AM Scope Withdrawal Time: 0 hours 6 minutes 27 seconds  Total Procedure Duration: 0 hours 8 minutes 54 seconds  Findings:                 The colon (entire examined portion) appeared normal.                           Non-bleeding internal hemorrhoids were found during                            retroflexion. The hemorrhoids were small and Grade                            I (internal hemorrhoids that do not prolapse).                           The terminal ileum appeared normal.                           The exam was otherwise without abnormality on                            direct and retroflexion views. Complications:  No immediate complications. Estimated Blood Loss:     Estimated blood loss: none. Impression:               - The entire examined colon is normal.                           - Non-bleeding internal hemorrhoids.                           - The examined portion of the ileum was normal.                           - The examination was otherwise normal on direct                            and retroflexion views.                           - No specimens collected. Recommendation:           - Patient has a contact number available for                            emergencies. The signs and symptoms of potential                            delayed complications were discussed with the                            patient. Return to normal activities tomorrow.                            Written discharge instructions were provided to the                             patient.                           - Resume previous diet.                           - Continue present medications.                           - Repeat colonoscopy in 10 years for screening                            purposes. Earlier, if with any new problems or                            change in family history.                           - The findings and recommendations were discussed                            with the patient's  family. Lynann Bologna, MD 09/16/2023 8:29:31 AM This report has been signed electronically.

## 2023-09-16 NOTE — Progress Notes (Signed)
Sedate, gd SR, tolerated procedure well, VSS, report to RN 

## 2023-09-16 NOTE — Progress Notes (Signed)
Pt's states no medical or surgical changes since previsit or office visit. 

## 2023-09-16 NOTE — Progress Notes (Signed)
[facility] History and Physical   Primary Care Physician:  Pcp, No   Reason for Procedure:   H/O polyps 2019  Plan:    colon     HPI: Taylor Rojas is a 62 y.o. female    Past Medical History:  Diagnosis Date   Allergy    Anxiety    Arthritis    Fibromyalgia    GERD (gastroesophageal reflux disease)    Impaired fasting glucose    SVT (supraventricular tachycardia) (HCC) 2018    Past Surgical History:  Procedure Laterality Date   BLADDER SURGERY     bladder tact   CARDIOVERSION  2018   CHOLECYSTECTOMY  12/2006   PARTIAL HYSTERECTOMY  02/2007   TUBAL LIGATION  1992    Prior to Admission medications   Medication Sig Start Date End Date Taking? Authorizing Provider  Cetirizine HCl (ZYRTEC ALLERGY PO)    Yes [provider]  ibuprofen (ADVIL) 800 MG tablet Take by mouth. 08/28/15   [provider]  [place] Lanius (OMEGA-3) 500 MG CAPS  05/26/23   [provider]  metoprolol tartrate (LOPRESSOR) 25 MG tablet Take 1 tablet as needed by oral route. Patient not taking: Reported on 09/16/2023 08/26/20   [provider]  triamcinolone (NASACORT ALLERGY 24HR) 55 MCG/ACT AERO nasal inhaler  05/05/21   [provider]  VAGIFEM 10 MCG TABS vaginal tablet Insert 1 tablet every day by vaginal route at bedtime for 14 days. 06/06/23   [provider]    Current Outpatient Medications  Medication Sig Dispense Refill   Cetirizine HCl (ZYRTEC ALLERGY PO)      ibuprofen (ADVIL) 800 MG tablet Take by mouth.     Krill Oil (OMEGA-3) 500 MG CAPS      metoprolol tartrate (LOPRESSOR) 25 MG tablet Take 1 tablet as needed by oral route. (Patient not taking: Reported on 09/16/2023)     triamcinolone (NASACORT ALLERGY 24HR) 55 MCG/ACT AERO nasal inhaler      VAGIFEM 10 MCG TABS vaginal tablet Insert 1 tablet every day by vaginal route at bedtime for 14 days.     Current Facility-Administered Medications  Medication Dose Route  Frequency Provider Last Rate Last Admin   0.9 %  sodium chloride infusion  500 mL Intravenous Continuous Lynann Bologna, MD        Allergies as of 09/16/2023 - Review Complete 09/16/2023  Allergen Reaction Noted   Tramadol Other (See Comments) 08/28/2018    Family History  Problem Relation Age of Onset   Hypertension Mother    Prostate cancer Father    Heart disease Maternal Grandmother    Hiatal hernia Maternal Grandmother    Parkinsonism Maternal Grandfather    Lung cancer Paternal Grandmother    Colon cancer Neg Hx    Esophageal cancer Neg Hx    Stomach cancer Neg Hx    Rectal cancer Neg Hx    Colon polyps Neg Hx     Social History   Socioeconomic History   Marital status: Married    Spouse name: Not on file   Number of children: 2   Years of education: Not on file   Highest education level: Not on file  Occupational History   Occupation: nurse  Tobacco Use   Smoking status: Never   Smokeless tobacco: Never  Vaping Use   Vaping status: Never Used  Substance and Sexual Activity   Alcohol use: No   Drug use: No   Sexual activity: Not on  file  Other Topics Concern   Not on file  Social History Narrative   Not on file   Social Determinants of Health   Financial Resource Strain: Not on file  Food Insecurity: Low Risk  (05/21/2023)   Received from [facility]   Hunger Vital Sign    Worried About Running Out of Food in the Last Year: Never true    Ran Out of Food in the Last Year: Never true  Transportation Needs: Not on file (05/21/2023)  Physical Activity: Not on file  Stress: Not on file  Social Connections: Not on file  Intimate Partner Violence: Not on file    Review of Systems: Positive for none All other review of systems negative except as mentioned in the HPI.  Physical Exam: Vital signs in last 24 hours: @VSRANGES @   General:   Alert,  Well-developed, well-nourished, pleasant and cooperative in NAD Lungs:  Clear throughout to auscultation.    Heart:  Regular rate and rhythm; no murmurs, clicks, rubs,  or gallops. Abdomen:  Soft, nontender and nondistended. Normal bowel sounds.   Neuro/Psych:  Alert and cooperative. Normal mood and affect. A and O x 3    No significant changes were identified.  The patient continues to be an appropriate candidate for the planned procedure and anesthesia.   Edman Circle, MD. [facility] 09/16/2023 8:29 AM@

## 2023-09-16 NOTE — Patient Instructions (Signed)
Thank you for letting us take care of your healthcare needs today. Please see handouts given to you on Hemorrhoids.    YOU HAD AN ENDOSCOPIC PROCEDURE TODAY AT THE [facility]:   Refer to the procedure report that was given to you for any specific questions about what was found during the examination.  If the procedure report does not answer your questions, please call your gastroenterologist to clarify.  If you requested that your care partner not be given the details of your procedure findings, then the procedure report has been included in a sealed envelope for you to review at your convenience later.  YOU SHOULD EXPECT: Some feelings of bloating in the abdomen. Passage of more gas than usual.  Walking can help get rid of the air that was put into your GI tract during the procedure and reduce the bloating. If you had a lower endoscopy (such as a colonoscopy or flexible sigmoidoscopy) you may notice spotting of blood in your stool or on the toilet paper. If you underwent a bowel prep for your procedure, you may not have a normal bowel movement for a few days.  Please Note:  You might notice some irritation and congestion in your nose or some drainage.  This is from the oxygen used during your procedure.  There is no need for concern and it should clear up in a day or so.  SYMPTOMS TO REPORT IMMEDIATELY:  Following lower endoscopy (colonoscopy or flexible sigmoidoscopy):  Excessive amounts of blood in the stool  Significant tenderness or worsening of abdominal pains  Swelling of the abdomen that is new, acute  Fever of 100F or higher   For urgent or emergent issues, a gastroenterologist can be reached at any hour by calling [phone]. Do not use MyChart messaging for urgent concerns.    DIET:  We do recommend a small meal at first, but then you may proceed to your regular diet.  Drink plenty of fluids but you should avoid alcoholic beverages for 24 hours.  ACTIVITY:  You  should plan to take it easy for the rest of today and you should NOT DRIVE or use heavy machinery until tomorrow (because of the sedation medicines used during the test).    FOLLOW UP: Our staff will call the number listed on your records the next business day following your procedure.  We will call around 7:15- 8:00 am to check on you and address any questions or concerns that you may have regarding the information given to you following your procedure. If we do not reach you, we will leave a message.     If any biopsies were taken you will be contacted by phone or by letter within the next 1-3 weeks.  Please call us at [phone] if you have not heard about the biopsies in 3 weeks.    SIGNATURES/CONFIDENTIALITY: You and/or your care partner have signed paperwork which will be entered into your electronic medical record.  These signatures attest to the fact that that the information above on your After Visit Summary has been reviewed and is understood.  Full responsibility of the confidentiality of this discharge information lies with you and/or your care-partner. 

## 2023-09-17 ENCOUNTER — Telehealth: Payer: Self-pay

## 2023-09-17 NOTE — Telephone Encounter (Signed)
Called patient back, she is wanting to make sure that MD did in fact want patient to have her next colonoscopy in 10 years rather than 5 years like in the past. Patient states that her husband has needed a 5 year recall even without having polyps several procedures in a row. I explained that the size of a polyp along with the number of polyps play a role in the guidelines for repeat colonoscopies. Please review and advise,

## 2023-09-17 NOTE — Telephone Encounter (Signed)
  Follow up Call-     09/16/2023    7:25 AM  Call back number  Post procedure Call Back phone  # [phone]  Permission to leave phone message Yes     Patient questions:  Do you have a fever, pain , or abdominal swelling? No. Pain Score  0 *  Have you tolerated food without any problems? Yes.    Have you been able to return to your normal activities? Yes.    Do you have any questions about your discharge instructions: Diet   No. Medications  No. Follow up visit  Yes.   See note below.  Do you have questions or concerns about your Care? Yes.   Per D/C Summary, patient is to have repeat colonoscopy in 10 years. Pt states she had polyps found on previous colonoscopy and wonders if she should repeat colonoscopy in 5 years instead.  Actions: * If pain score is 4 or above: No action needed, pain <4.

## 2023-09-17 NOTE — Telephone Encounter (Signed)
Colon 2019- 2 small polyps (TA, SSA) Rpt colon 09/2023: neg Current recommendations - rpt 10 yrs. Certainly earlier if any new problems or change in family history RG

## 2023-09-18 NOTE — Telephone Encounter (Signed)
Pt made aware of Dr. Lyndel Safe recommendations: Pt verbalized understanding with all questions answered.
